# Patient Record
Sex: Male | Born: 2000 | Race: Black or African American | Hispanic: No | Marital: Single | Smoking: Former smoker
Health system: Southern US, Community
[De-identification: ages and names within clinical notes are randomized; demographics above are authoritative.]

## PROBLEM LIST (undated history)

## (undated) DIAGNOSIS — F419 Anxiety disorder, unspecified: Secondary | ICD-10-CM

## (undated) DIAGNOSIS — F32A Depression, unspecified: Secondary | ICD-10-CM

## (undated) DIAGNOSIS — F329 Major depressive disorder, single episode, unspecified: Secondary | ICD-10-CM

---
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
## 2021-05-17 IMAGING — DX DG HAND COMPLETE 3+V*R*
4 series · 4 of 4 positions shown · non-contrast
Comparison: None.

CLINICAL DATA: 18-year-old male with trauma to the right hand.

EXAM:
RIGHT HAND - COMPLETE 3+ VIEW

[hand ap]
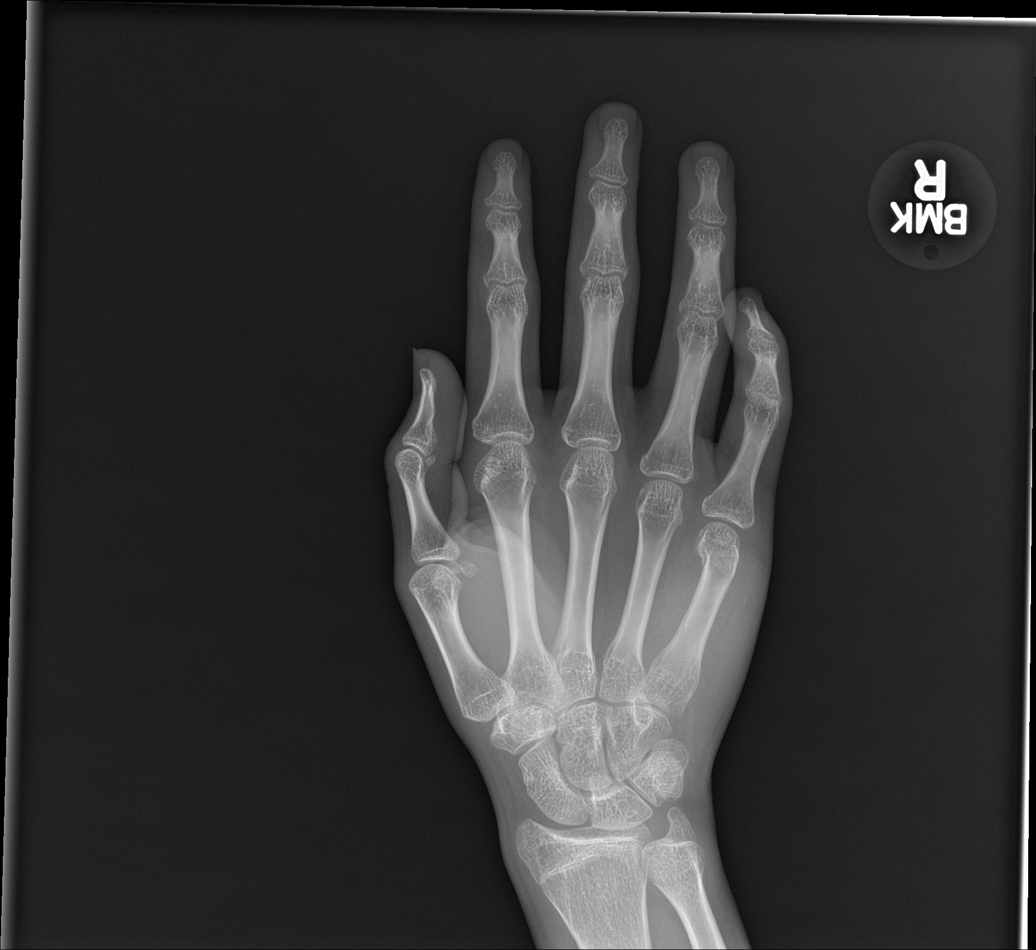

[hand obl]
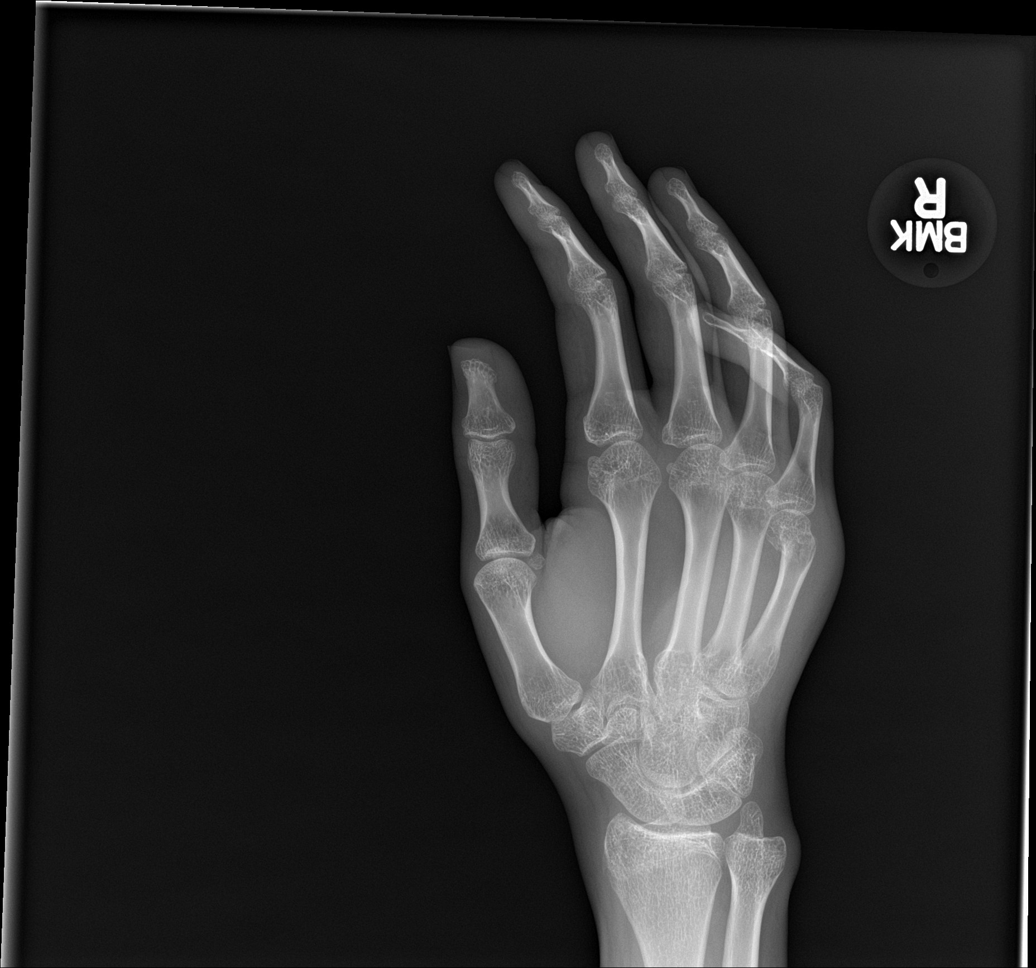

[hand lat (1 of 2)]
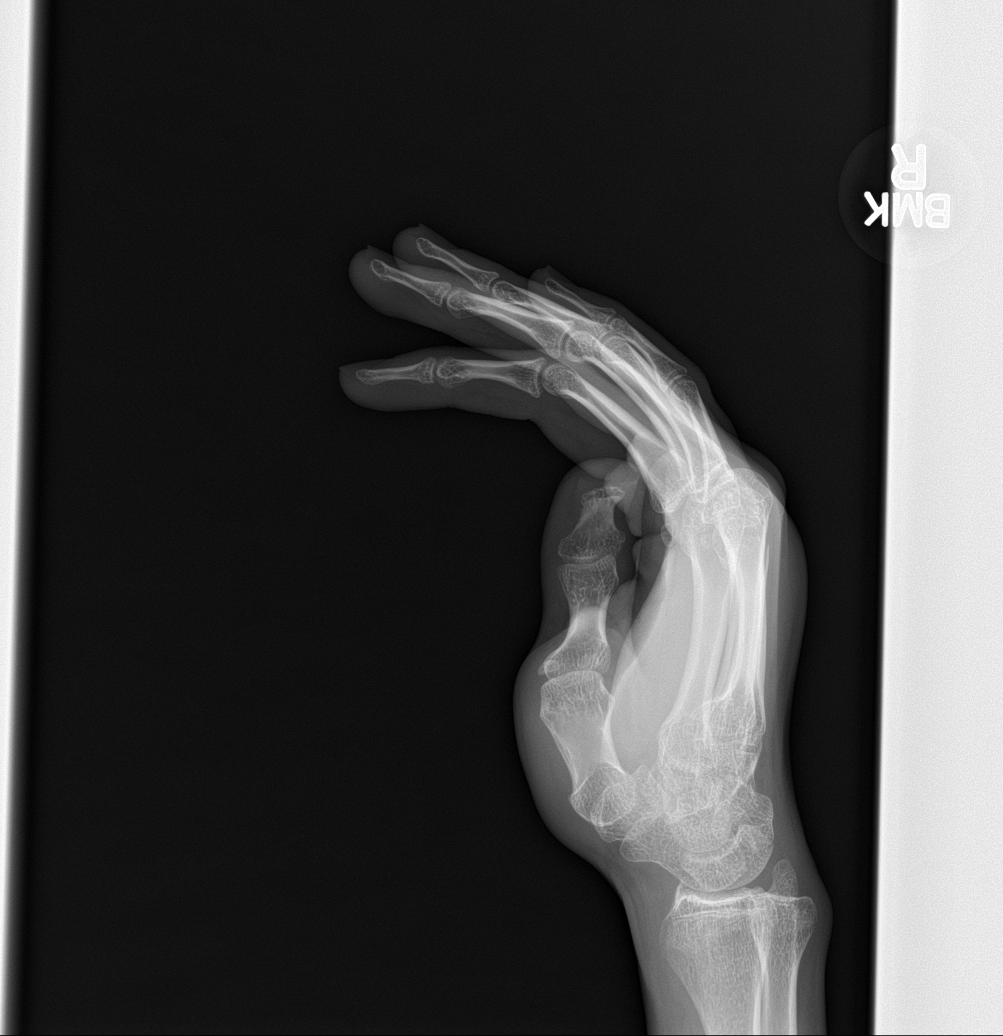

[hand lat (2 of 2)]
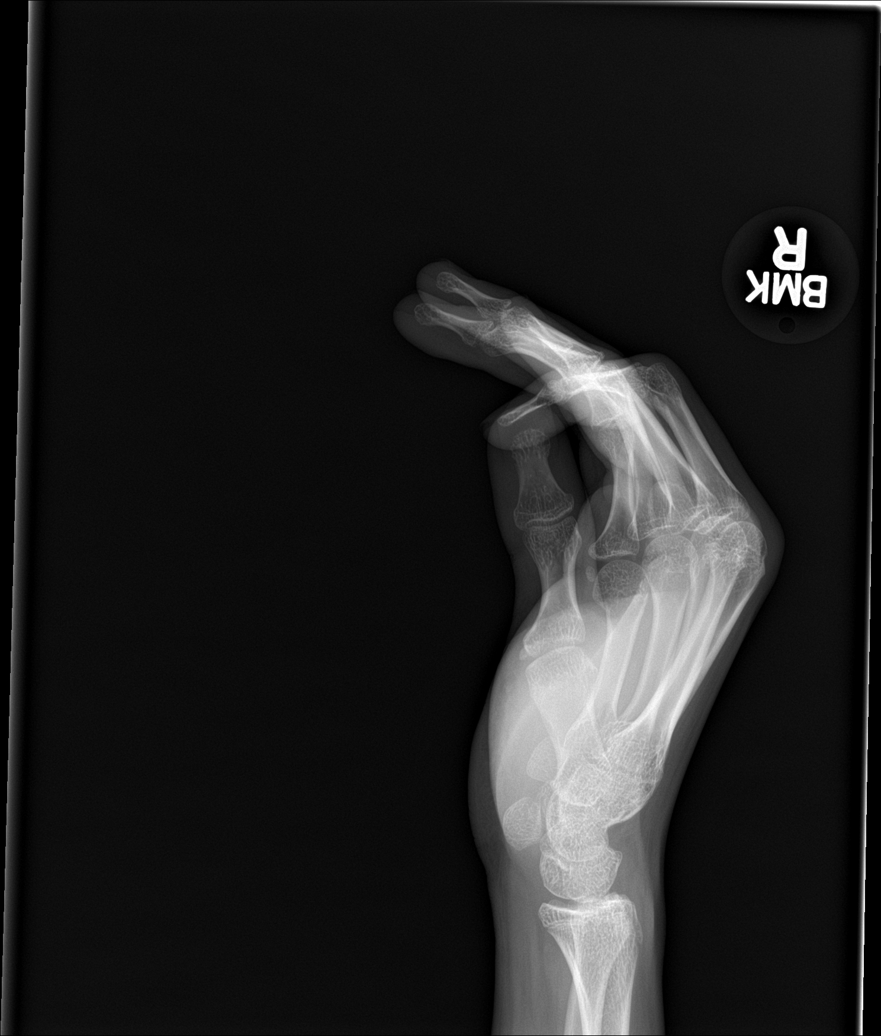

[4 of 4 positions shown; findings below may reference images not displayed]

FINDINGS: There is a mildly angulated fracture of the distal fifth metacarpal
with volar angulation of the distal fracture fragment. No other
acute fracture identified. There is no dislocation. There is
juxta-articular osteopenia. There is soft tissue swelling over the
fifth MCP joint. No radiopaque foreign object or soft tissue gas.
IMPRESSION: Mildly angulated fracture of the distal fifth metacarpal. No
dislocation.
